# Patient Record
Sex: Male | Born: 1980 | Race: White | Hispanic: No | Marital: Married | Smoking: Never smoker
Health system: Southern US, Community
[De-identification: ages and names within clinical notes are randomized; demographics above are authoritative.]

---

## 2009-05-18 ENCOUNTER — Emergency Department: Payer: Self-pay | Admitting: Emergency Medicine

## 2015-02-27 ENCOUNTER — Encounter: Payer: Self-pay | Admitting: Emergency Medicine

## 2015-02-27 ENCOUNTER — Emergency Department
Admission: EM | Admit: 2015-02-27 | Discharge: 2015-02-27 | Disposition: A | Discharge status: Home / Self Care | Payer: BLUE CROSS/BLUE SHIELD | Attending: Emergency Medicine | Admitting: Emergency Medicine

## 2015-02-27 DIAGNOSIS — H5712 Ocular pain, left eye: Secondary | ICD-10-CM | POA: Diagnosis not present

## 2015-02-27 NOTE — ED Provider Notes (Signed)
[facility] Emergency Department Provider Note  ____________________________________________  Time seen: Approximately 12:25 PM  I have reviewed the triage vital signs and the nursing notes.   HISTORY  Chief Complaint Eye Problem    HPI James Brennan is a 34 y.o. male complaining of 2 years the left eye pain and intermittent pressure. Patient states been followed by his PCP has taken several drops which are not affected. Patient with corrective lenses is states no eye discrepancy. Denies any foreign body sensation. Patient rates his discomfort as 8/10. Patient states does not seen ophthalmologist for this complaint.   History reviewed. No pertinent past medical history.  There are no active problems to display for this patient.   History reviewed. No pertinent past surgical history.  No current outpatient prescriptions on file.  Allergies Review of patient's allergies indicates no known allergies.  No family history on file.  Social History History  Substance Use Topics  . Smoking status: Never Smoker   . Smokeless tobacco: Not on file  . Alcohol Use: Not on file    Review of Systems Constitutional: No fever/chills Eyes: No visual changes. Intermittent pain and pressure to the left eye. ENT: No sore throat. Cardiovascular: Denies chest pain. Respiratory: Denies shortness of breath. Gastrointestinal: No abdominal pain.  No nausea, no vomiting.  No diarrhea.  No constipation. Genitourinary: Negative for dysuria. Musculoskeletal: Negative for back pain. Skin: Negative for rash. Neurological: Negative for headaches, focal weakness or numbness.  10-point ROS otherwise negative.  ____________________________________________   PHYSICAL EXAM:  VITAL SIGNS: ED Triage Vitals  Enc Vitals Group     BP 02/27/15 1154 139/81 mmHg     Pulse Rate 02/27/15 1154 68     Resp 02/27/15 1154 18     Temp 02/27/15 1154 98 F (36.7 C)     Temp Source  02/27/15 1154 Oral     SpO2 02/27/15 1154 100 %     Weight 02/27/15 1154 180 lb (81.647 kg)     Height 02/27/15 1154 5\' 10"  (1.778 m)     Head Cir --      Peak Flow --      Pain Score 02/27/15 1156 8     Pain Loc --      Pain Edu? --      Excl. in GC? --    Constitutional: Alert and oriented. Well appearing and in no acute distress. Eyes: Conjunctivae are normal. PERRL. EOMI. Head: Atraumatic. Nose: No congestion/rhinnorhea. Mouth/Throat: Mucous membranes are moist.  Oropharynx non-erythematous. Neck: No stridor.   Hematological/Lymphatic/Immunilogical: No cervical lymphadenopathy. Cardiovascular: Normal rate, regular rhythm. Grossly normal heart sounds.  Good peripheral circulation. Respiratory: Normal respiratory effort.  No retractions. Lungs CTAB. Gastrointestinal: Soft and nontender. No distention. No abdominal bruits. No CVA tenderness. Musculoskeletal: No lower extremity tenderness nor edema.  No joint effusions. Neurologic:  Normal speech and language. No gross focal neurologic deficits are appreciated. Speech is normal. No gait instability. Skin:  Skin is warm, dry and intact. No rash noted. Psychiatric: Mood and affect are normal. Speech and behavior are normal.  ____________________________________________   LABS (all labs ordered are listed, but only abnormal results are displayed)  Labs Reviewed - No data to display ____________________________________________  EKG   ____________________________________________  RADIOLOGY   ____________________________________________   PROCEDURES  Procedure(s) performed: None  Critical Care performed: No  ____________________________________________   INITIAL IMPRESSION / ASSESSMENT AND PLAN / ED COURSE  Pertinent labs & imaging results that were available during  my care of the patient were reviewed by me and considered in my medical decision making (see chart for details).  Chronic left eye pain  pressure ____________________________________________   FINAL CLINICAL IMPRESSION(S) / ED DIAGNOSES  Final diagnoses:  Eye pain, left      Joni Reining, PA-C 02/27/15 1240  Darci Current, MD 03/01/15 2238

## 2015-02-27 NOTE — ED Notes (Signed)
Denies getting anything in eye

## 2015-02-27 NOTE — Discharge Instructions (Signed)
Due to the length of your eye pain advised follow up with Eye clinic on Monday. Call  Monday morning for an appointment.

## 2015-02-27 NOTE — ED Notes (Signed)
NAD noted at time of D/C. Pt denies questions or concerns. Pt ambulatory to the lobby at this time.  

## 2015-09-14 ENCOUNTER — Ambulatory Visit
Admission: EM | Admit: 2015-09-14 | Discharge: 2015-09-14 | Disposition: A | Discharge status: Home / Self Care | Payer: BLUE CROSS/BLUE SHIELD | Attending: Family Medicine | Admitting: Family Medicine

## 2015-09-14 DIAGNOSIS — J019 Acute sinusitis, unspecified: Secondary | ICD-10-CM

## 2015-09-14 DIAGNOSIS — H5712 Ocular pain, left eye: Secondary | ICD-10-CM

## 2015-09-14 DIAGNOSIS — H04552 Acquired stenosis of left nasolacrimal duct: Secondary | ICD-10-CM

## 2015-09-14 DIAGNOSIS — J3489 Other specified disorders of nose and nasal sinuses: Secondary | ICD-10-CM | POA: Diagnosis not present

## 2015-09-14 DIAGNOSIS — J069 Acute upper respiratory infection, unspecified: Secondary | ICD-10-CM

## 2015-09-14 MED ORDER — FEXOFENADINE-PSEUDOEPHED ER 180-240 MG PO TB24: 1.0000 | ORAL_TABLET | Freq: Every day | ORAL | Status: DC

## 2015-09-14 MED ORDER — AMOXICILLIN-POT CLAVULANATE 875-125 MG PO TABS: 1.0000 | ORAL_TABLET | Freq: Two times a day (BID) | ORAL | Status: AC

## 2015-09-14 MED ORDER — FEXOFENADINE-PSEUDOEPHED ER 180-240 MG PO TB24: 1.0000 | ORAL_TABLET | Freq: Every day | ORAL | Status: AC

## 2015-09-14 MED ORDER — AMOXICILLIN-POT CLAVULANATE 875-125 MG PO TABS: 1.0000 | ORAL_TABLET | Freq: Two times a day (BID) | ORAL | Status: DC

## 2015-09-14 NOTE — ED Notes (Signed)
Nasal congestion x 2 weeks. Pt is concerned because he was diagnosed with blepharitis of the left eye, which is the same side his sinus pressure is located.

## 2015-09-14 NOTE — ED Provider Notes (Signed)
CSN: [id number]     Arrival date & time 09/14/15  1955 History   First MD Initiated Contact with Patient 09/14/15 2108    Nurses notes were reviewed. Chief Complaint  Patient presents with  . Sinus Problem   Patient reports having a cold for about a week to 2 weeks ago. Reports pressure underneath his left eye about 5 days ago which is persistent. Patient is currently being treated by an ophthalmologist for blepharitis left eye after he was treated with high-dose steroids for an eye condition of his left eye by optometrist with the ophthalmologist thought was over treatment of the left eye condition. He states ophthalmologist since changed his diagnosis and has a mole warm compress doxycycline 100 mg and other treatments. He reports being concerned because of pressure around his left eye and was concerned since she's had the URI they may had a left sinus infection. He does not normally get sinus infections. No fever no drainage from the left side.  (Consider location/radiation/quality/duration/timing/severity/associated sxs/prior Treatment) Patient is a 35 y.o. male presenting with sinus complaint. The history is provided by the patient. No language interpreter was used.  Sinus Problem This is a new problem. The current episode started more than 2 days ago. The problem occurs constantly. The problem has not changed since onset.Nothing aggravates the symptoms. Nothing relieves the symptoms. He has tried a warm compress for the symptoms. The treatment provided no relief.    History reviewed. No pertinent past medical history. History reviewed. No pertinent past surgical history. History reviewed. No pertinent family history. Social History  Substance Use Topics  . Smoking status: Never Smoker   . Smokeless tobacco: None  . Alcohol Use: No    Review of Systems  HENT: Positive for rhinorrhea and sinus pressure.   Eyes: Positive for pain.  All other systems reviewed and are  negative.   Allergies  Review of patient's allergies indicates no known allergies.  Home Medications   Prior to Admission medications   Medication Sig Start Date End Date Taking? Authorizing Provider  doxycycline (MONODOX) 100 MG capsule Take 100 mg by mouth daily.   Yes Historical Provider, MD  amoxicillin-clavulanate (AUGMENTIN) 875-125 MG tablet Take 1 tablet by mouth 2 (two) times daily. 09/14/15   Hassan RowanEugene Syrianna Schillaci, MD  fexofenadine-pseudoephedrine (ALLEGRA-D ALLERGY & CONGESTION) 180-240 MG 24 hr tablet Take 1 tablet by mouth daily. 09/14/15   Hassan RowanEugene Lella Mullany, MD   Meds Ordered and Administered this Visit  Medications - No data to display  BP 142/87 mmHg  Pulse 74  Temp(Src) 97.6 F (36.4 C) (Tympanic)  Resp 16  Ht 5\' 10"  (1.778 m)  Wt 180 lb (81.647 kg)  BMI 25.83 kg/m2  SpO2 98% No data found.   Physical Exam  Constitutional: He is oriented to person, place, and time. He appears well-developed and well-nourished.  HENT:  Head: Normocephalic.  Right Ear: Hearing, tympanic membrane and ear canal normal.  Left Ear: Hearing, tympanic membrane, external ear and ear canal normal.  Nose: Mucosal edema and sinus tenderness present. No rhinorrhea. Right sinus exhibits no maxillary sinus tenderness and no frontal sinus tenderness. Left sinus exhibits no frontal sinus tenderness.    Mouth/Throat: Uvula is midline and oropharynx is clear and moist.  Eyes: Pupils are equal, round, and reactive to light.  Neck: Neck supple.  Musculoskeletal: Normal range of motion. He exhibits no edema.  Neurological: He is alert and oriented to person, place, and time.  Skin: Skin is warm. No rash  noted. No erythema.  Psychiatric: He has a normal mood and affect. His behavior is normal.  Vitals reviewed.   ED Course  Procedures (including critical care time)  Labs Review Labs Reviewed - No data to display  Imaging Review No results found.   Visual Acuity Review  Right Eye  Distance: 20/30 Left Eye Distance: 20/25 Bilateral Distance: 20/25 (corrected)  Right Eye Near:   Left Eye Near:    Bilateral Near:         MDM   1. Blocked tear duct in infant, left   2. URI (upper respiratory infection)   3. Acute sinusitis, recurrence not specified, unspecified location    Discussed patient I really don't think that this is a sinus infection I think small the blocked tear duct because the tear duct on the left side. Be swollen. He seemed ophthalmologist tomorrow explained that the ophthalmologist may need to dilate the left ear duct that said with him having URI symptoms about a week ago increased pressure last 5 days and a history of being misdiagnosed by the optometrist earlier from August I'm going to go ahead and place him on Augmentin and Allegra-D just to make sure there were not missing sinus infection. At the ophthalmologist feels as blocked tear ducts. The Augmentin tomorrow5 and he seems to be agreeable with that.    Hassan Rowan, MD 09/14/15 2132

## 2015-09-14 NOTE — Discharge Instructions (Signed)
Upper Respiratory Infection, Adult °Most upper respiratory infections (URIs) are caused by a virus. A URI affects the nose, throat, and upper air passages. The most common type of URI is often called "the common cold." °HOME CARE  °· Take medicines only as told by your doctor. °· Gargle warm saltwater or take cough drops to comfort your throat as told by your doctor. °· Use a warm mist humidifier or inhale steam from a shower to increase air moisture. This may make it easier to breathe. °· Drink enough fluid to keep your pee (urine) clear or pale yellow. °· Eat soups and other clear broths. °· Have a healthy diet. °· Rest as needed. °· Go back to work when your fever is gone or your doctor says it is okay. °· You may need to stay home longer to avoid giving your URI to others. °· You can also wear a face mask and wash your hands often to prevent spread of the virus. °· Use your inhaler more if you have asthma. °· Do not use any tobacco products, including cigarettes, chewing tobacco, or electronic cigarettes. If you need help quitting, ask your doctor. °GET HELP IF: °· You are getting worse, not better. °· Your symptoms are not helped by medicine. °· You have chills. °· You are getting more short of breath. °· You have brown or red mucus. °· You have yellow or brown discharge from your nose. °· You have pain in your face, especially when you bend forward. °· You have a fever. °· You have puffy (swollen) neck glands. °· You have pain while swallowing. °· You have white areas in the back of your throat. °GET HELP RIGHT AWAY IF:  °· You have very bad or constant: °· Headache. °· Ear pain. °· Pain in your forehead, behind your eyes, and over your cheekbones (sinus pain). °· Chest pain. °· You have long-lasting (chronic) lung disease and any of the following: °· Wheezing. °· Long-lasting cough. °· Coughing up blood. °· A change in your usual mucus. °· You have a stiff neck. °· You have changes in  your: °· Vision. °· Hearing. °· Thinking. °· Mood. °MAKE SURE YOU:  °· Understand these instructions. °· Will watch your condition. °· Will get help right away if you are not doing well or get worse. °  °This information is not intended to replace advice given to you by your health care provider. Make sure you discuss any questions you have with your health care provider. °  °Document Released: 02/14/2008 Document Revised: 01/12/2015 Document Reviewed: 12/03/2013 °Elsevier Interactive Patient Education ©2016 [employer] ° °Sinusitis, Adult °Sinusitis is redness, soreness, and puffiness (inflammation) of the air pockets in the bones of your face (sinuses). The redness, soreness, and puffiness can cause air and mucus to get trapped in your sinuses. This can allow germs to grow and cause an infection.  °HOME CARE  °· Drink enough fluids to keep your pee (urine) clear or pale yellow. °· Use a humidifier in your home. °· Run a hot shower to create steam in the bathroom. Sit in the bathroom with the door closed. Breathe in the steam 3-4 times a day. °· Put a warm, moist washcloth on your face 3-4 times a day, or as told by your doctor. °· Use salt water sprays (saline sprays) to wet the thick fluid in your nose. This can help the sinuses drain. °· Only take medicine as told by your doctor. °GET HELP RIGHT AWAY IF:  °·   Your pain gets worse. °· You have very bad headaches. °· You are sick to your stomach (nauseous). °· You throw up (vomit). °· You are very sleepy (drowsy) all the time. °· Your face is puffy (swollen). °· Your vision changes. °· You have a stiff neck. °· You have trouble breathing. °MAKE SURE YOU:  °· Understand these instructions. °· Will watch your condition. °· Will get help right away if you are not doing well or get worse. °  °This information is not intended to replace advice given to you by your health care provider. Make sure you discuss any questions you have with your health care provider. °   °Document Released: 02/14/2008 Document Revised: 09/18/2014 Document Reviewed: 04/02/2012 °Elsevier Interactive Patient Education ©2016 [employer] ° °

## 2016-11-08 ENCOUNTER — Emergency Department
Admission: EM | Admit: 2016-11-08 | Discharge: 2016-11-08 | Disposition: A | Discharge status: Home / Self Care | Payer: BLUE CROSS/BLUE SHIELD | Attending: Emergency Medicine | Admitting: Emergency Medicine

## 2016-11-08 ENCOUNTER — Emergency Department: Payer: BLUE CROSS/BLUE SHIELD

## 2016-11-08 DIAGNOSIS — R079 Chest pain, unspecified: Secondary | ICD-10-CM

## 2016-11-08 DIAGNOSIS — Z79899 Other long term (current) drug therapy: Secondary | ICD-10-CM | POA: Insufficient documentation

## 2016-11-08 DIAGNOSIS — R0789 Other chest pain: Secondary | ICD-10-CM | POA: Diagnosis not present

## 2016-11-08 LAB — BASIC METABOLIC PANEL
Anion gap: 9 (ref 5–15)
BUN: 17 mg/dL (ref 6–20)
CALCIUM: 9.4 mg/dL (ref 8.9–10.3)
CO2: 24 mmol/L (ref 22–32)
CREATININE: 1.29 mg/dL — AB (ref 0.61–1.24)
Chloride: 106 mmol/L (ref 101–111)
GFR calc Af Amer: >60 mL/min (ref >60)
GLUCOSE: 106 mg/dL — AB (ref 65–99)
Potassium: 3.8 mmol/L (ref 3.5–5.1)
Sodium: 139 mmol/L (ref 135–145)

## 2016-11-08 LAB — CBC
HCT: 46.9 % (ref 40.0–52.0)
Hemoglobin: 17.2 g/dL (ref 13.0–18.0)
MCH: 33.4 pg (ref 26.0–34.0)
MCHC: 36.7 g/dL — AB (ref 32.0–36.0)
MCV: 90.8 fL (ref 80.0–100.0)
Platelets: 220 10*3/uL (ref 150–440)
RBC: 5.17 MIL/uL (ref 4.40–5.90)
RDW: 12.6 % (ref 11.5–14.5)
WBC: 8.5 10*3/uL (ref 3.8–10.6)

## 2016-11-08 LAB — TROPONIN I

## 2016-11-08 LAB — TSH: TSH: 2.142 u[IU]/mL (ref 0.350–4.500)

## 2016-11-08 LAB — T4, FREE: Free T4: 0.97 ng/dL (ref 0.61–1.12)

## 2016-11-08 NOTE — ED Triage Notes (Signed)
Pt reports having heart palpitations for the last month with chest pain starting Friday - he repots that the chest pain feels like something shocking him - denies shortness of breath - appears in no acute distress  - denies nausea/vomting - pain radiates into top of left shoulder

## 2016-11-08 NOTE — ED Notes (Signed)
Patient left before discharge paperwork and discharge education could be given.

## 2016-11-08 NOTE — ED Provider Notes (Signed)
[facility] Emergency Department Provider Note  ____________________________________________  Time seen: Approximately 7:28 PM  I have reviewed the triage vital signs and the nursing notes.   HISTORY  Chief Complaint Chest Pain    HPI James Brennan is a 36 y.o. male who complains of intermittent palpitations for the past month. They seem to be worse with caffeine so he stopped taking caffeine over the past week with some slight improvement. Not related to exertion or other specific activity. No dizziness or lightheadedness or syncope. Not exertional. No shortness of breath. Then over the last 4-5 days, he's also been having intermittent electric chest pain radiating from the left clavicle down to the left chest wall. He isat these pains are worse when he lifts his arms above his head with weight lifting has noticed that certain movements seem to exacerbate it, but other exertional activities like running a mile on the treadmill and doing non-arm weights does not seem to aggravate it.  Recently had a URI with a lingering cough for about 4 weeks. Took decongestants for it, but was not taking any medications at the time of these symptoms starting. Pain is not pleuritic. No shortness of breath vomiting or diaphoresis. Last only a fleeting moment, intermittent, worse with certain movements as above. No alleviating factors. No trauma    History reviewed. No pertinent past medical history.   There are no active problems to display for this patient.    History reviewed. No pertinent surgical history.   Prior to Admission medications   Medication Sig Start Date End Date Taking? Authorizing Provider  amoxicillin-clavulanate (AUGMENTIN) 875-125 MG tablet Take 1 tablet by mouth 2 (two) times daily. 09/14/15   Hassan Rowan, MD  doxycycline (MONODOX) 100 MG capsule Take 100 mg by mouth daily.    Historical Provider, MD  fexofenadine-pseudoephedrine (ALLEGRA-D ALLERGY &  CONGESTION) 180-240 MG 24 hr tablet Take 1 tablet by mouth daily. 09/14/15   Hassan Rowan, MD     Allergies Patient has no known allergies.   No family history on file.  Social History Social History  Substance Use Topics  . Smoking status: Never Smoker  . Smokeless tobacco: Never Used  . Alcohol use No    Review of Systems  Constitutional:   No fever or chills.  ENT:   No sore throat. No rhinorrhea. Cardiovascular:   Positive as above chest pain. Respiratory:   No dyspnea or cough. Gastrointestinal:   Negative for abdominal pain, vomiting and diarrhea.  Genitourinary:   Negative for dysuria or difficulty urinating. Musculoskeletal:   Negative for focal pain or swelling Neurological:   Negative for headaches 10-point ROS otherwise negative.  ____________________________________________   PHYSICAL EXAM:  VITAL SIGNS: ED Triage Vitals  Enc Vitals Group     BP 11/08/16 1812 (!) 164/99     Pulse Rate 11/08/16 1812 79     Resp 11/08/16 1812 17     Temp 11/08/16 1812 98.1 F (36.7 C)     Temp Source 11/08/16 1812 Oral     SpO2 11/08/16 1812 100 %     Weight 11/08/16 1811 180 lb (81.6 kg)     Height 11/08/16 1811 5\' 10"  (1.778 m)     Head Circumference --      Peak Flow --      Pain Score 11/08/16 1811 3     Pain Loc --      Pain Edu? --      Excl. in GC? --  Vital signs reviewed, nursing assessments reviewed.   Constitutional:   Alert and oriented. Well appearing and in no distress. Eyes:   No scleral icterus. No conjunctival pallor. EOMI.  No nystagmus. ENT   Head:   Normocephalic and atraumatic.   Neck:   No stridor. No SubQ emphysema. No meningismus. Hematological/Lymphatic/Immunilogical:   No cervical lymphadenopathy. Cardiovascular:   RRR. Symmetric bilateral radial and DP pulses.  No murmurs.  Respiratory:   Normal respiratory effort without tachypnea nor retractions. Breath sounds are clear and equal bilaterally. No wheezes/rales/rhonchi.Chest  wall nontender Gastrointestinal:   Soft and nontender. Non distended. There is no CVA tenderness.  No rebound, rigidity, or guarding. Musculoskeletal:   Normal range of motion in all extremities. No joint effusions.  No lower extremity tenderness.  No edema. Neurologic:   Normal speech and language.  CN 2-10 normal. Motor grossly intact. No gross focal neurologic deficits are appreciated.  ____________________________________________    LABS (pertinent positives/negatives) (all labs ordered are listed, but only abnormal results are displayed) Labs Reviewed  BASIC METABOLIC PANEL - Abnormal; Notable for the following:       Result Value   Glucose, Bld 106 (*)    Creatinine, Ser 1.29 (*)    All other components within normal limits  CBC - Abnormal; Notable for the following:    MCHC 36.7 (*)    All other components within normal limits  TROPONIN I  TSH  T4, FREE   ____________________________________________   EKG  Interpreted by me  Date: 11/08/2016  Rate: 73  Rhythm: normal sinus rhythm  QRS Axis: normal  Intervals: normal  ST/T Wave abnormalities: normal  Conduction Disutrbances: none  Narrative Interpretation: unremarkable      ____________________________________________    RADIOLOGY  Dg Chest 2 View  Result Date: 11/08/2016 CLINICAL DATA:  36 y/o  M; chest pain. EXAM: CHEST  2 VIEW COMPARISON:  None. FINDINGS: The heart size and mediastinal contours are within normal limits. Both lungs are clear. The visualized skeletal structures are unremarkable. IMPRESSION: No active cardiopulmonary disease. Electronically Signed   By: Mitzi HansenLance  Furusawa-Stratton M.D.   On: 11/08/2016 18:39    ____________________________________________   PROCEDURES Procedures  ____________________________________________   INITIAL IMPRESSION / ASSESSMENT AND PLAN / ED COURSE  Pertinent labs & imaging results that were available during my care of the patient were reviewed by me  and considered in my medical decision making (see chart for details).  Patient well appearing no acute distress, presents with nonspecific atypical chest pain. He is in good physical condition, no comorbidities or other risk factors for underlying heart disease or connective tissue disorder.Considering the patient's symptoms, medical history, and physical examination today, I have low suspicion for ACS, PE, TAD, pneumothorax, carditis, mediastinitis, pneumonia, CHF, or sepsis.  Likely chest wall pain although not reproducible on exam. I'll add on thyroid studies, although he is not in thyroid storm. Does report a lot of stress at work recently which may be related. Offered patient trial of beta blocker for symptom palpitations which he declines. Offered steroids which he declines.         ____________________________________________   FINAL CLINICAL IMPRESSION(S) / ED DIAGNOSES  Final diagnoses:  Nonspecific chest pain      New Prescriptions   No medications on file     Portions of this note were generated with dragon dictation software. Dictation errors may occur despite best attempts at proofreading.    Sharman CheekPhillip Bonnell Placzek, MD 11/08/16 [phone]

## 2018-09-06 IMAGING — CR DG CHEST 2V
1 series · 2 of 2 positions shown · non-contrast
Comparison: None.

CLINICAL DATA: 35 y/o  M; chest pain.

EXAM:
CHEST  2 VIEW

[Series 1: dg chest 2 view · 0.14mm/px · 2 of 2 slices shown]
[im 1/2]
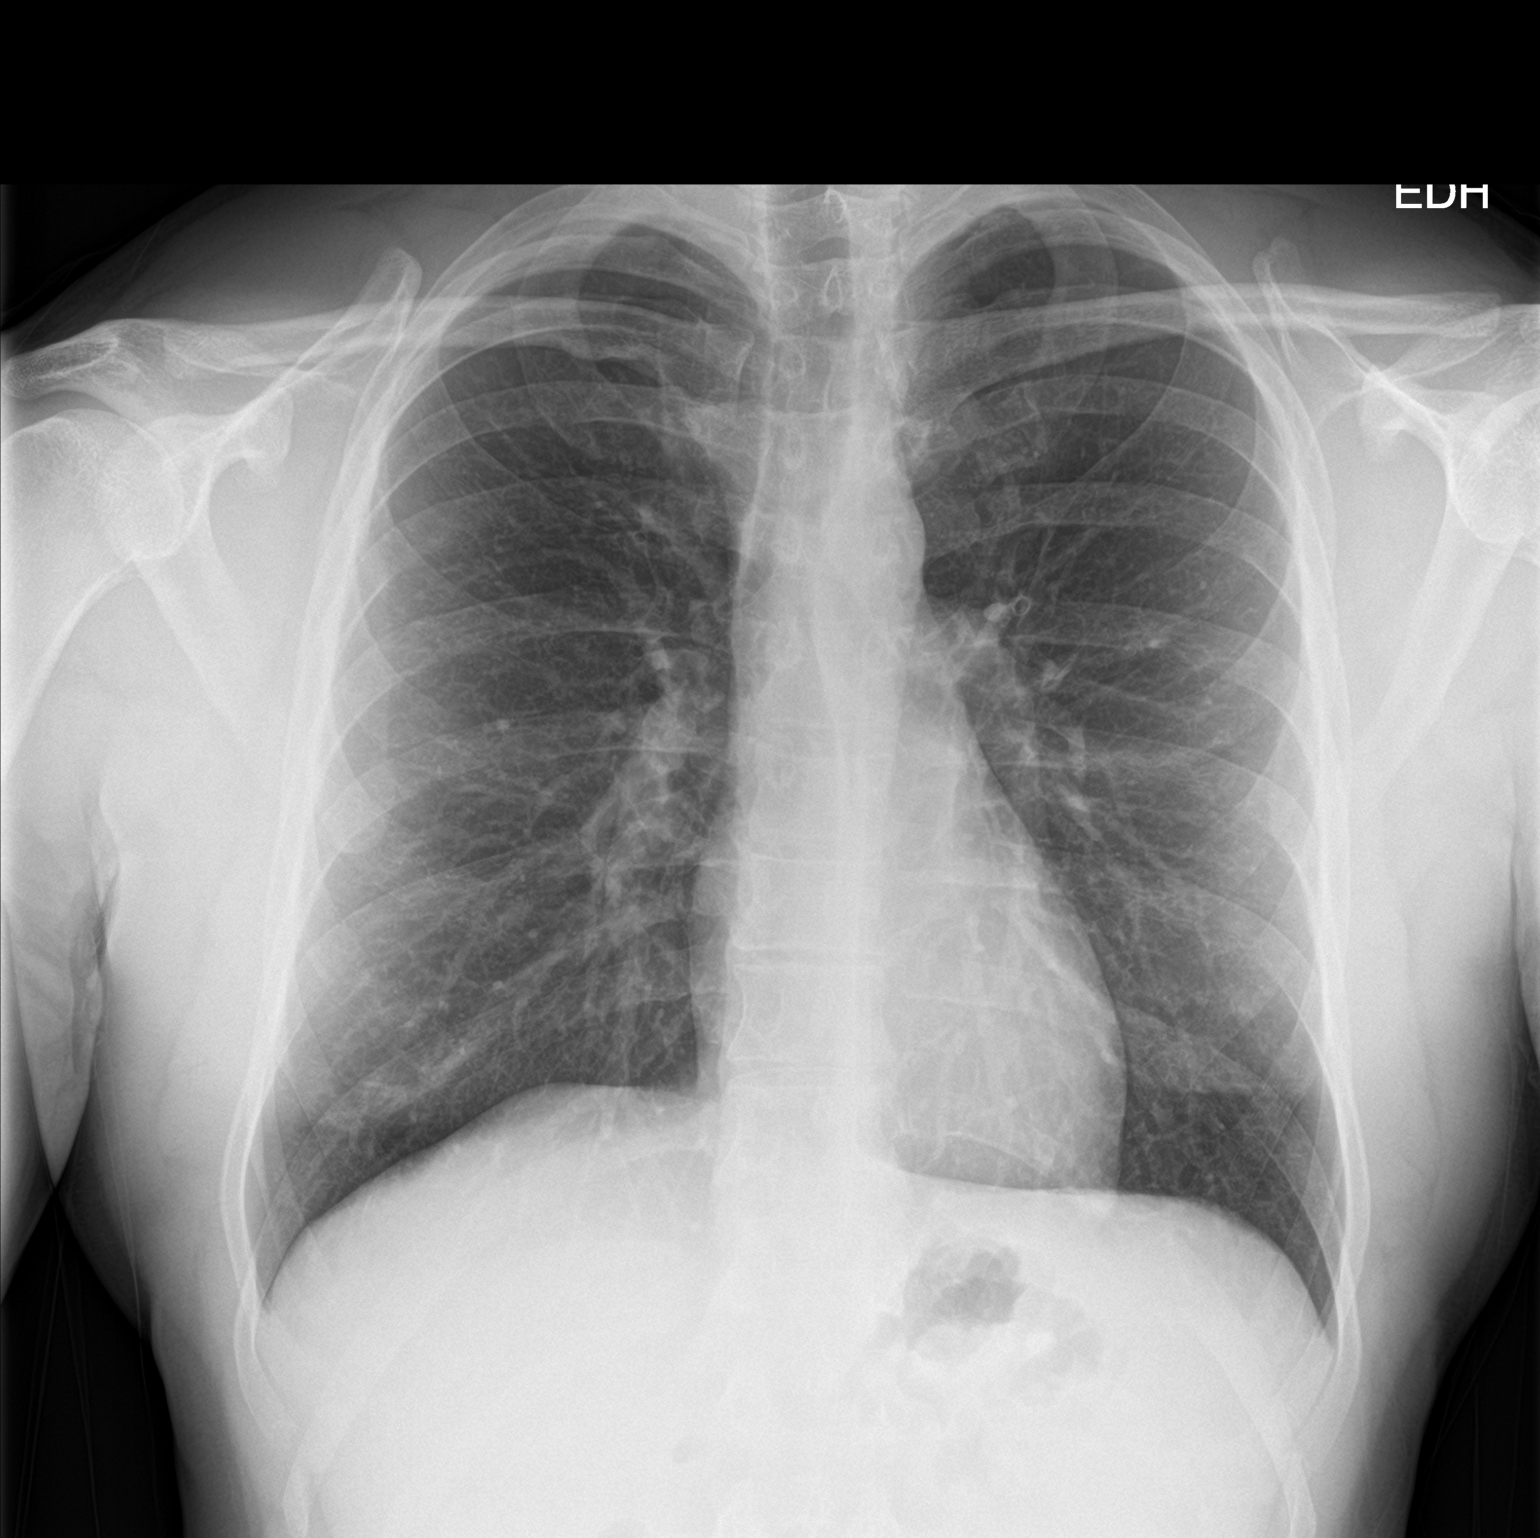
[im 2/2]
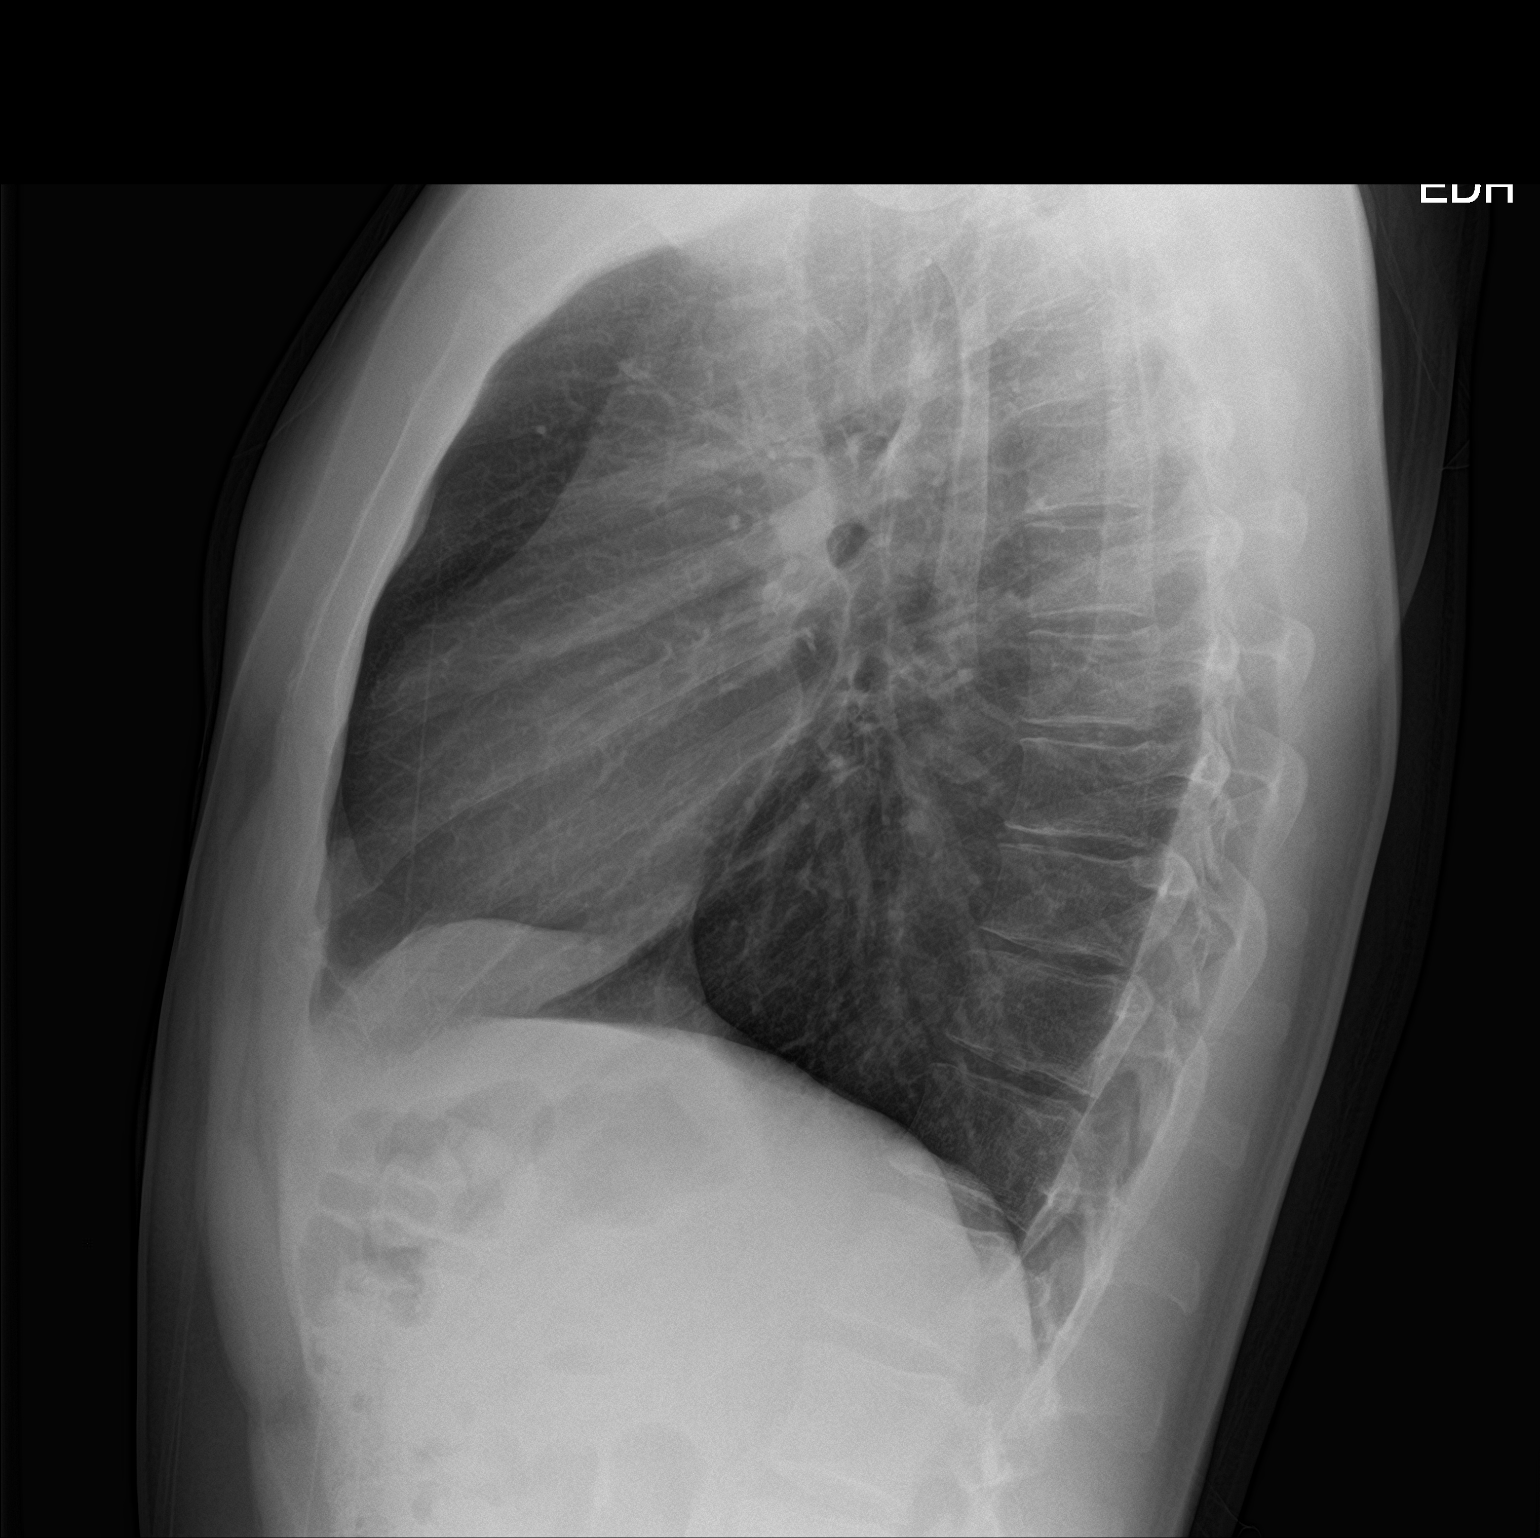

[2 of 2 positions shown; findings below may reference images not displayed]

FINDINGS: The heart size and mediastinal contours are within normal limits.
Both lungs are clear. The visualized skeletal structures are
unremarkable.
IMPRESSION: No active cardiopulmonary disease.

By: Carl-Magnus Nahimana M.D.
